# Patient Record
Sex: Male | Born: 1985 | Race: White | Hispanic: No | Marital: Married | Smoking: Former smoker
Health system: Southern US, Community
[De-identification: ages and names within clinical notes are randomized; demographics above are authoritative.]

## PROBLEM LIST (undated history)

## (undated) HISTORY — PX: NO PAST SURGERIES: SHX2092

---

## 2016-08-06 ENCOUNTER — Encounter: Payer: Self-pay | Admitting: Family Medicine

## 2016-08-06 ENCOUNTER — Ambulatory Visit (INDEPENDENT_AMBULATORY_CARE_PROVIDER_SITE_OTHER): Payer: BLUE CROSS/BLUE SHIELD | Admitting: Family Medicine

## 2016-08-06 VITALS — BP 138/79 | HR 84 | Temp 97.0°F | Resp 20 | Ht 72.5 in | Wt 212.5 lb

## 2016-08-06 DIAGNOSIS — M25562 Pain in left knee: Secondary | ICD-10-CM | POA: Diagnosis not present

## 2016-08-06 DIAGNOSIS — Z7689 Persons encountering health services in other specified circumstances: Secondary | ICD-10-CM

## 2016-08-06 DIAGNOSIS — T148XXA Other injury of unspecified body region, initial encounter: Secondary | ICD-10-CM | POA: Insufficient documentation

## 2016-08-06 NOTE — Patient Instructions (Signed)
Hematoma A hematoma is a collection of blood. The collection of blood can turn into a hard, painful lump under the skin. Your skin may turn blue or yellow if the hematoma is close to the surface of the skin. Most hematomas get better in a few days to weeks. Some hematomas are serious and need medical care. Hematomas can be very small or very big. Follow these instructions at home:  Apply ice to the injured area: ? Put ice in a plastic bag. ? Place a towel between your skin and the bag. ? Leave the ice on for 20 minutes, 2-3 times a day for the first 1 to 2 days.  After the first 2 days, switch to using warm packs on the injured area.  Raise (elevate) the injured area to lessen pain and puffiness (swelling). You may also wrap the area with an elastic bandage. Make sure the bandage is not wrapped too tight.  If you have a painful hematoma on your leg or foot, you may use crutches for a couple days.  Only take medicines as told by your doctor. Get help right away if:  Your pain gets worse.  Your pain is not controlled with medicine.  You have a fever.  Your puffiness gets worse.  Your skin turns more blue or yellow.  Your skin over the hematoma breaks or starts bleeding.  Your hematoma is in your chest or belly (abdomen) and you are short of breath, feel weak, or have a change in consciousness.  Your hematoma is on your scalp and you have a headache that gets worse or a change in alertness or consciousness. This information is not intended to replace advice given to you by your health care provider. Make sure you discuss any questions you have with your health care provider. Document Released: 02/12/2004 Document Revised: 06/12/2015 Document Reviewed: 06/14/2012 Elsevier Interactive Patient Education  2017 Elsevier Inc.  
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
## 2016-08-09 ENCOUNTER — Encounter: Payer: Self-pay | Admitting: Family Medicine

## 2016-08-31 ENCOUNTER — Telehealth: Payer: Self-pay | Admitting: Family Medicine

## 2016-08-31 NOTE — Telephone Encounter (Signed)
Spoke with patient regarding symptoms. Patient reports occasional shortness of breath x 1 week (4-6 times a day), typically while at rest. Feels that his "chest bones needs to be popped". Denies medication or diet changes, is currently attempting to quit smoking. Denies chest pain, heart rate changes, diaphoresis or lightheadedness. Advised patient if symptoms persist or worsen, to go to an Urgent Care or Emergency Department for evaluation. Patient verbalized understanding. Patient scheduled with PCP for 09/01/2016.

## 2016-08-31 NOTE — Telephone Encounter (Signed)
New Message  Pt c/o Shortness Of Breath: STAT if SOB developed within the last 24 hours or pt is noticeably SOB on the phone  1. Are you currently SOB (can you hear that pt is SOB on the phone)? No wife is on the phone  2. How long have you been experiencing SOB? 2 weeks  3. Are you SOB when sitting or when up moving around? Both  4. Are you currently experiencing any other symptoms? No, pts wife voiced to schedule pt so he has an appt on tomorrow at 930 but if he needs to be seen sooner to call.  Pts wife also voiced he has problems breathing and feels like something is sitting on his chest.  Please advise

## 2016-09-01 ENCOUNTER — Encounter: Payer: Self-pay | Admitting: Family Medicine

## 2016-09-01 ENCOUNTER — Ambulatory Visit (INDEPENDENT_AMBULATORY_CARE_PROVIDER_SITE_OTHER): Payer: BLUE CROSS/BLUE SHIELD | Admitting: Family Medicine

## 2016-09-01 ENCOUNTER — Telehealth: Payer: Self-pay | Admitting: Family Medicine

## 2016-09-01 ENCOUNTER — Ambulatory Visit (HOSPITAL_BASED_OUTPATIENT_CLINIC_OR_DEPARTMENT_OTHER)
Admission: RE | Admit: 2016-09-01 | Discharge: 2016-09-01 | Disposition: A | Discharge status: Home / Self Care | Payer: BLUE CROSS/BLUE SHIELD | Source: Ambulatory Visit | Attending: Family Medicine | Admitting: Family Medicine

## 2016-09-01 VITALS — BP 107/62 | HR 61 | Temp 98.0°F | Resp 20 | Wt 221.0 lb

## 2016-09-01 DIAGNOSIS — R0602 Shortness of breath: Secondary | ICD-10-CM | POA: Diagnosis not present

## 2016-09-01 DIAGNOSIS — T148XXA Other injury of unspecified body region, initial encounter: Secondary | ICD-10-CM | POA: Diagnosis not present

## 2016-09-01 DIAGNOSIS — R002 Palpitations: Secondary | ICD-10-CM

## 2016-09-01 LAB — CBC WITH DIFFERENTIAL/PLATELET
BASOS ABS: 0.1 10*3/uL (ref 0.0–0.1)
Basophils Relative: 1.3 % (ref 0.0–3.0)
Eosinophils Absolute: 0.4 10*3/uL (ref 0.0–0.7)
Eosinophils Relative: 5.9 % — ABNORMAL HIGH (ref 0.0–5.0)
HEMATOCRIT: 49.6 % (ref 39.0–52.0)
HEMOGLOBIN: 16.4 g/dL (ref 13.0–17.0)
LYMPHS PCT: 29.9 % (ref 12.0–46.0)
Lymphs Abs: 1.8 10*3/uL (ref 0.7–4.0)
MCHC: 33.2 g/dL (ref 30.0–36.0)
MCV: 95.5 fl (ref 78.0–100.0)
MONOS PCT: 9 % (ref 3.0–12.0)
Monocytes Absolute: 0.6 10*3/uL (ref 0.1–1.0)
NEUTROS ABS: 3.3 10*3/uL (ref 1.4–7.7)
Neutrophils Relative %: 53.9 % (ref 43.0–77.0)
Platelets: 239 10*3/uL (ref 150.0–400.0)
RBC: 5.19 Mil/uL (ref 4.22–5.81)
RDW: 12.7 % (ref 11.5–15.5)
WBC: 6.2 10*3/uL (ref 4.0–10.5)

## 2016-09-01 LAB — D-DIMER, QUANTITATIVE: D-Dimer, Quant: 0.27 mcg/mL FEU (ref <0.50)

## 2016-09-01 NOTE — Telephone Encounter (Signed)
Please call pt: - his CXR and Doppler is normal.  - the lab that would suggest lung blood clot if elevated (D-Dimer is normal.  - The other labs are still pending and will not be completed until tomorrow. We call again.  - Currently my thoughts are leaning towards possible palpitations as cause, but would like to wait for lab results for further recommendations
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
## 2016-09-01 NOTE — Telephone Encounter (Signed)
Spoke with patient reviewed lab results . Patient verbalized understanding. 

## 2016-09-01 NOTE — Patient Instructions (Signed)
Please have xray completed today at  [facility] [address] Just walk in.  They will call you to schedule US.  results from labs and imaging will dictate any further work up.   Any increase in Shortness of breath, chest pain please go to ED.

## 2016-09-02 ENCOUNTER — Telehealth: Payer: Self-pay | Admitting: Family Medicine

## 2016-09-02 ENCOUNTER — Encounter: Payer: Self-pay | Admitting: Family Medicine

## 2016-09-02 ENCOUNTER — Telehealth: Payer: Self-pay

## 2016-09-02 DIAGNOSIS — R0602 Shortness of breath: Secondary | ICD-10-CM | POA: Insufficient documentation

## 2016-09-02 LAB — BASIC METABOLIC PANEL WITH GFR
BUN: 15 mg/dL (ref 7–25)
CHLORIDE: 102 mmol/L (ref 98–110)
CO2: 26 mmol/L (ref 20–32)
Calcium: 9.8 mg/dL (ref 8.6–10.3)
Creat: 0.9 mg/dL (ref 0.60–1.35)
GFR, Est African American: >89 mL/min (ref >=60)
GLUCOSE: 94 mg/dL (ref 65–99)
POTASSIUM: 4.4 mmol/L (ref 3.5–5.3)
SODIUM: 137 mmol/L (ref 135–146)

## 2016-09-02 LAB — TSH: TSH: 1.36 m[IU]/L (ref 0.40–4.50)

## 2016-09-02 NOTE — Telephone Encounter (Signed)
Please call patient: All of his imaging studies and labs are normal. I do not feel there is a need to move further with a CT at this time. His symptoms sound more consistent with heart palpitations, which can make someone feel like they are losing their breath for a few seconds, like he reports. - Avoid caffeine use, as this can make palpitations worsen - I would like to refer him to cardiology to further evaluate. The would likely have him wear a monitor for a few days to see if they can capture changes and heart activity when symptoms are present. - If he is agreeable to this referral, I will place it today.

## 2016-09-02 NOTE — Telephone Encounter (Signed)
TSH was added to [facility] lab 09/01/16 via phone call per Dr. Claiborne BillingsKuneff.

## 2016-09-03 NOTE — Telephone Encounter (Signed)
Patient notified and verbalized understanding. Patient has gone a diet where he is abstaining from all caffeine drinks. He wants to hold off on referral for now and will call back if changes his mind.
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
